# Patient Record
Sex: Male | Born: 1965 | Marital: Married | Smoking: Former smoker
Health system: Southern US, Community
[De-identification: ages and names within clinical notes are randomized; demographics above are authoritative.]

## PROBLEM LIST (undated history)

## (undated) DIAGNOSIS — I10 Essential (primary) hypertension: Secondary | ICD-10-CM

---

## 2011-11-12 ENCOUNTER — Other Ambulatory Visit: Payer: Self-pay | Admitting: Infectious Diseases

## 2011-11-12 ENCOUNTER — Ambulatory Visit
Admission: RE | Admit: 2011-11-12 | Discharge: 2011-11-12 | Disposition: A | Discharge status: Home / Self Care | Payer: No Typology Code available for payment source | Source: Ambulatory Visit | Attending: Infectious Diseases | Admitting: Infectious Diseases

## 2011-11-12 DIAGNOSIS — R7611 Nonspecific reaction to tuberculin skin test without active tuberculosis: Secondary | ICD-10-CM

## 2012-01-13 ENCOUNTER — Emergency Department (HOSPITAL_COMMUNITY)
Admission: EM | Admit: 2012-01-13 | Discharge: 2012-01-13 | Discharge status: Against Medical Advice | Payer: No Typology Code available for payment source | Attending: Emergency Medicine | Admitting: Emergency Medicine

## 2012-01-13 DIAGNOSIS — Z79899 Other long term (current) drug therapy: Secondary | ICD-10-CM | POA: Insufficient documentation

## 2012-01-13 DIAGNOSIS — Y939 Activity, unspecified: Secondary | ICD-10-CM | POA: Insufficient documentation
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
## 2016-02-11 ENCOUNTER — Encounter (HOSPITAL_COMMUNITY): Payer: Self-pay

## 2016-02-11 ENCOUNTER — Emergency Department (HOSPITAL_COMMUNITY)
Admission: EM | Admit: 2016-02-11 | Discharge: 2016-02-12 | Disposition: A | Discharge status: Home / Self Care | Payer: BLUE CROSS/BLUE SHIELD | Attending: Emergency Medicine | Admitting: Emergency Medicine

## 2016-02-11 ENCOUNTER — Emergency Department (HOSPITAL_COMMUNITY): Payer: BLUE CROSS/BLUE SHIELD

## 2016-02-11 DIAGNOSIS — I10 Essential (primary) hypertension: Secondary | ICD-10-CM | POA: Insufficient documentation

## 2016-02-11 DIAGNOSIS — Z79899 Other long term (current) drug therapy: Secondary | ICD-10-CM | POA: Diagnosis not present

## 2016-02-11 DIAGNOSIS — R079 Chest pain, unspecified: Secondary | ICD-10-CM

## 2016-02-11 DIAGNOSIS — Z87891 Personal history of nicotine dependence: Secondary | ICD-10-CM | POA: Insufficient documentation

## 2016-02-11 DIAGNOSIS — I712 Thoracic aortic aneurysm, without rupture, unspecified: Secondary | ICD-10-CM

## 2016-02-11 HISTORY — DX: Essential (primary) hypertension: I10

## 2016-02-11 LAB — I-STAT TROPONIN, ED: TROPONIN I, POC: 0 ng/mL (ref 0.00–0.08)

## 2016-02-11 LAB — BASIC METABOLIC PANEL
ANION GAP: 11 (ref 5–15)
BUN: 9 mg/dL (ref 6–20)
CHLORIDE: 102 mmol/L (ref 101–111)
CO2: 26 mmol/L (ref 22–32)
Calcium: 9.9 mg/dL (ref 8.9–10.3)
Creatinine, Ser: 1.09 mg/dL (ref 0.61–1.24)
GFR calc non Af Amer: >60 mL/min (ref >60)
Glucose, Bld: 122 mg/dL — ABNORMAL HIGH (ref 65–99)
Potassium: 3.8 mmol/L (ref 3.5–5.1)
SODIUM: 139 mmol/L (ref 135–145)

## 2016-02-11 LAB — CBC
HCT: 52.4 % — ABNORMAL HIGH (ref 39.0–52.0)
HEMOGLOBIN: 17.9 g/dL — AB (ref 13.0–17.0)
MCH: 28.7 pg (ref 26.0–34.0)
MCHC: 34.2 g/dL (ref 30.0–36.0)
MCV: 84.1 fL (ref 78.0–100.0)
Platelets: 228 10*3/uL (ref 150–400)
RBC: 6.23 MIL/uL — AB (ref 4.22–5.81)
RDW: 13.2 % (ref 11.5–15.5)
WBC: 7.7 10*3/uL (ref 4.0–10.5)

## 2016-02-11 NOTE — ED Provider Notes (Signed)
[facility]-EMERGENCY DEPT Provider Note   CSN: [id number] Arrival date & time: 02/11/16  1738   By signing my name below, I, Clarisse Gouge, attest that this documentation has been prepared under the direction and in the presence of Gilda Crease, MD. Electronically signed, Clarisse Gouge, ED Scribe. 02/12/16. 1:46 AM.   History   Chief Complaint Chief Complaint  Patient presents with  . Chest Pain   The history is provided by the patient and the spouse. No language interpreter was used.   HPI Comments: Jason Allison is a 50 y.o. male who presents to the Emergency Department from [facility] complaining of off and on chest pain and palpitations x 3 days. He reports associated fatigue. Pt notes he cannot reproduce the chest pain. He notes intermittent pain episodes lasting ~10 minutes at a time, and her further relays chest pain radiates into his back. He discloces prior 10 year Hx of tobacco use, though he quit years ago. Pt reveals FMHx and PMHx of HTN. He is not sure if he has a Hx of HLD as he states his levesl have never been checked.  He denies abdominal pain and Hx of blood clots, heart attack, or Dm. No other symptoms at this time.  Past Medical History:  Diagnosis Date  . Hypertension     There are no active problems to display for this patient.   History reviewed. No pertinent surgical history.     Home Medications    Prior to Admission medications   Medication Sig Start Date End Date Taking? Authorizing Provider  bisoprolol (ZEBETA) 5 MG tablet Take 5 mg by mouth daily.    Historical Provider, MD  lisinopril (PRINIVIL,ZESTRIL) 20 MG tablet Take 20 mg by mouth daily.    Historical Provider, MD    Family History No family history on file.  Social History Social History  Substance Use Topics  . Smoking status: Former Games developer  . Smokeless tobacco: Never Used  . Alcohol use No     Allergies   Patient has no known allergies.   Review of Systems Review of  Systems  All other systems reviewed and are negative. A complete 10 system review of systems was obtained and all systems are negative except as noted in the HPI and PMH.     Physical Exam Updated Vital Signs BP 158/96   Pulse 90   Temp 98.1 F (36.7 C) (Oral)   Resp 16   SpO2 97%   Physical Exam  Constitutional: He is oriented to person, place, and time. He appears well-developed and well-nourished. No distress.  HENT:  Head: Normocephalic and atraumatic.  Right Ear: Hearing normal.  Left Ear: Hearing normal.  Nose: Nose normal.  Mouth/Throat: Oropharynx is clear and moist and mucous membranes are normal.  Eyes: Conjunctivae and EOM are normal. Pupils are equal, round, and reactive to light.  Neck: Normal range of motion. Neck supple.  Cardiovascular: Normal rate, regular rhythm, S1 normal, S2 normal and normal heart sounds.  Exam reveals no gallop and no friction rub.   No murmur heard. Pulmonary/Chest: Effort normal and breath sounds normal. No respiratory distress. He exhibits tenderness (lateral).  Abdominal: Soft. Normal appearance and bowel sounds are normal. There is no hepatosplenomegaly. There is no tenderness. There is no rebound, no guarding, no tenderness at McBurney's point and negative Murphy's sign. No hernia.  Musculoskeletal: Normal range of motion.  Neurological: He is alert and oriented to person, place, and time. He has normal strength. No cranial  nerve deficit or sensory deficit. Coordination normal. GCS eye subscore is 4. GCS verbal subscore is 5. GCS motor subscore is 6.  Skin: Skin is warm, dry and intact. No rash noted. No cyanosis.  Psychiatric: He has a normal mood and affect. His speech is normal and behavior is normal. Thought content normal.  Nursing note and vitals reviewed.    ED Treatments / Results  Labs (all labs ordered are listed, but only abnormal results are displayed) Labs Reviewed  BASIC METABOLIC PANEL - Abnormal; Notable for the  following:       Result Value   Glucose, Bld 122 (*)    All other components within normal limits  CBC - Abnormal; Notable for the following:    RBC 6.23 (*)    Hemoglobin 17.9 (*)    HCT 52.4 (*)    All other components within normal limits  I-STAT TROPOININ, ED  I-STAT TROPOININ, ED    EKG  EKG Interpretation  Date/Time:  Tuesday February 11 2016 17:47:50 EST Ventricular Rate:  116 PR Interval:  132 QRS Duration: 102 QT Interval:  338 QTC Calculation: 469 R Axis:   117 Text Interpretation:  Sinus tachycardia Left posterior fascicular block Possible Inferior infarct , age undetermined Cannot rule out Anterior infarct , age undetermined Abnormal ECG No previous tracing Confirmed by Erickson Yamashiro  MD, Hadyn Azer [phone]([id number]) on 02/11/2016 11:24:23 PM       Radiology Dg Chest 2 View  Result Date: 02/11/2016 CLINICAL DATA:  Acute onset of left-sided chest pain, radiating to the back. Generalized weakness. Initial encounter. EXAM: CHEST  2 VIEW COMPARISON:  Chest radiograph performed 11/12/2011 FINDINGS: Lung expansion is mildly decreased from the prior study. There is no evidence of focal opacification, pleural effusion or pneumothorax. The heart is normal in size; the mediastinal contour is within normal limits. No acute osseous abnormalities are seen. IMPRESSION: No acute cardiopulmonary process seen. Electronically Signed   By: Roanna RaiderJeffery  Chang M.D.   On: 02/11/2016 18:51   Ct Angio Chest Pe W Or Wo Contrast  Result Date: 02/12/2016 CLINICAL DATA:  Acute onset of generalized chest pain. Tachycardia. Initial encounter. EXAM: CT ANGIOGRAPHY CHEST WITH CONTRAST TECHNIQUE: Multidetector CT imaging of the chest was performed using the standard protocol during bolus administration of intravenous contrast. Multiplanar CT image reconstructions and MIPs were obtained to evaluate the vascular anatomy. CONTRAST:  100 mL of Isovue 370 IV contrast COMPARISON:  Chest radiograph performed 02/11/2016  FINDINGS: Cardiovascular:  There is no evidence of pulmonary embolus. The heart is unremarkable in appearance. There is borderline dilatation of the ascending thoracic aorta to 4.1 cm in AP dimension. This resolves at the proximal aortic arch. No calcific atherosclerotic disease is seen. The great vessels are within normal limits. Mediastinum/Nodes: The mediastinum is otherwise unremarkable. No mediastinal lymphadenopathy is seen. No pericardial effusion is identified. The visualized portions of the thyroid gland are unremarkable. No axillary lymphadenopathy is appreciated. Lungs/Pleura: Minimal bibasilar atelectasis is noted. The lungs are otherwise clear. No pleural effusion or pneumothorax is seen. No masses are identified. A calcified granuloma is noted at the right middle lobe. Upper Abdomen: The visualized portions of the liver and spleen are unremarkable. The visualized portions of the pancreas are within normal limits. Musculoskeletal: No acute osseous abnormalities are identified. The visualized musculature is unremarkable in appearance. Review of the MIP images confirms the above findings. IMPRESSION: 1. No evidence of pulmonary embolus. 2. Minimal bibasilar atelectasis noted.  Lungs otherwise clear. 3. Borderline dilatation of the  ascending thoracic aorta to 4.1 cm in AP dimension. Recommend annual imaging followup by CTA or MRA. This recommendation follows 2010 ACCF/AHA/AATS/ACR/ASA/SCA/SCAI/SIR/STS/SVM Guidelines for the Diagnosis and Management of Patients with Thoracic Aortic Disease. Circulation. 2010; 121: W098-J191: [id number] Electronically Signed   By: Roanna RaiderJeffery  Chang M.D.   On: 02/12/2016 01:27    Procedures Procedures (including critical care time)  Medications Ordered in ED Medications  iopamidol (ISOVUE-370) 76 % injection (100 mLs  Contrast Given 02/12/16 0052)     Initial Impression / Assessment and Plan / ED Course  I have reviewed the triage vital signs and the nursing notes.  Pertinent  labs & imaging results that were available during my care of the patient were reviewed by me and considered in my medical decision making (see chart for details).  Clinical Course    Patient presents to the emergency department for evaluation of chest pain. He has been having intermittent episodes of discomfort in the left lateral chest area associated with palpitations. He does have some reproducibility of the pain with palpation of the chest wall. He had a mild sinus tachycardia at arrival which has resolved. Patient does appear to be slightly anxious. He has minimal cardiac risk factors. EKG with nonspecific T-wave inversions, possible LVH. No ST depressions or elevations. Patient has had 2 negative troponins here in the ER. Heart score is 3, felt to be adequate workup and does not require admission. Can follow-up as an outpatient.  Based on patient's chest pain and tachycardia, PE was also considered. CT angiography performed. No evidence of PE as noted, but patient does have borderline dilatation of the thoracic aorta consistent with aneurysm. No evidence of rupture. This does not expand the patient's current symptoms. Will need to be monitored. Patient will be referred to cardiology for monitoring of thoracic aortic aneurysm, workup for chest pain and palpitations.  Final Clinical Impressions(s) / ED Diagnoses   Final diagnoses:  Nonspecific chest pain  Thoracic aortic aneurysm without rupture (HCC)   I personally performed the services described in this documentation, which was scribed in my presence. The recorded information has been reviewed and is accurate.  New Prescriptions New Prescriptions   No medications on file     Gilda Creasehristopher J Phebe Dettmer, MD 02/12/16 [phone]
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
## 2016-02-11 NOTE — ED Notes (Signed)
EKG done

## 2016-02-12 ENCOUNTER — Emergency Department (HOSPITAL_COMMUNITY): Payer: BLUE CROSS/BLUE SHIELD

## 2016-02-12 LAB — I-STAT TROPONIN, ED: TROPONIN I, POC: 0 ng/mL (ref 0.00–0.08)

## 2016-02-12 MED ORDER — IOPAMIDOL (ISOVUE-370) INJECTION 76%
INTRAVENOUS | Status: AC
  Administered 2016-02-12: 100 mL
  Filled 2016-02-12: qty 100

## 2016-02-12 NOTE — ED Notes (Signed)
Pt departed in NAD, refused use of wheelchair.  

## 2017-04-25 IMAGING — CT CT ANGIO CHEST
2 of 6 series · 18 of 36 positions shown · IV contrast (Omni 300)
Comparison: Chest radiograph performed 02/11/2016

CLINICAL DATA: Acute onset of generalized chest pain. Tachycardia.
Initial encounter.

EXAM:
CT ANGIOGRAPHY CHEST WITH CONTRAST
TECHNIQUE: Multidetector CT imaging of the chest was performed using the
standard protocol during bolus administration of intravenous
contrast. Multiplanar CT image reconstructions and MIPs were
obtained to evaluate the vascular anatomy.
CONTRAST:  100 mL of Isovue 370 IV contrast

[Series 6: pe thins · axial · 0.69mm/px · z∈[+1425,+1634]mm · 17 of 237 slices shown]
[im 14/237  lung]
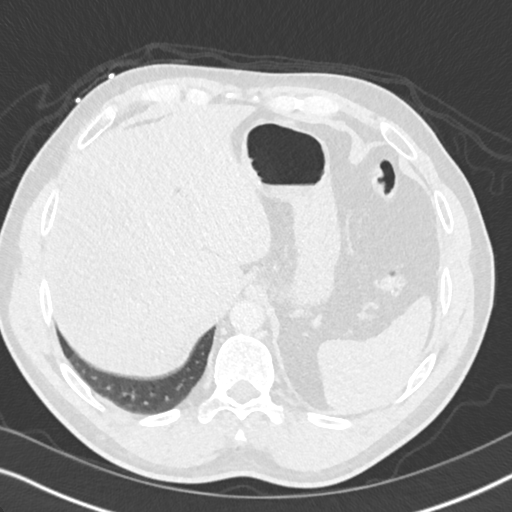
[im 27/237  mediastinal]
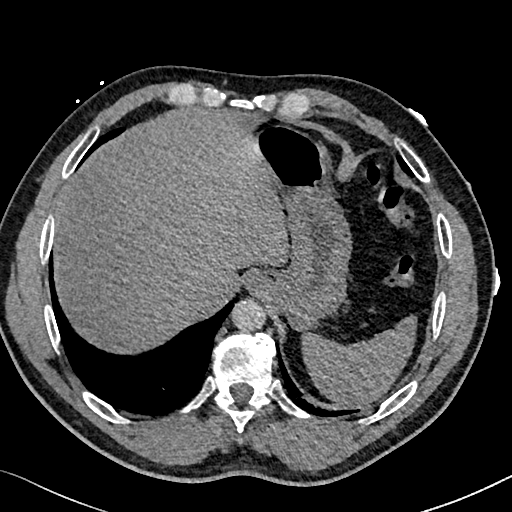
[im 40/237  lung]
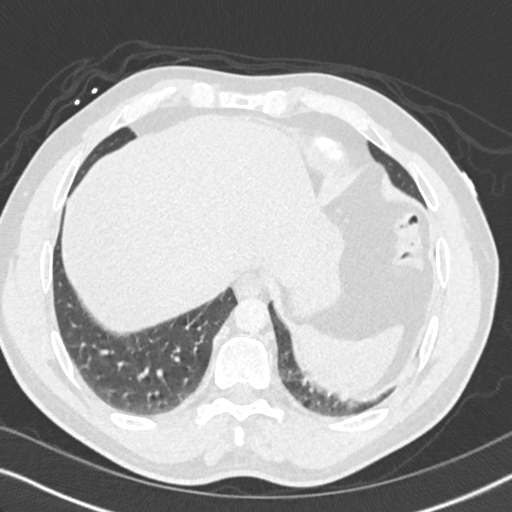
[im 53/237  mediastinal]
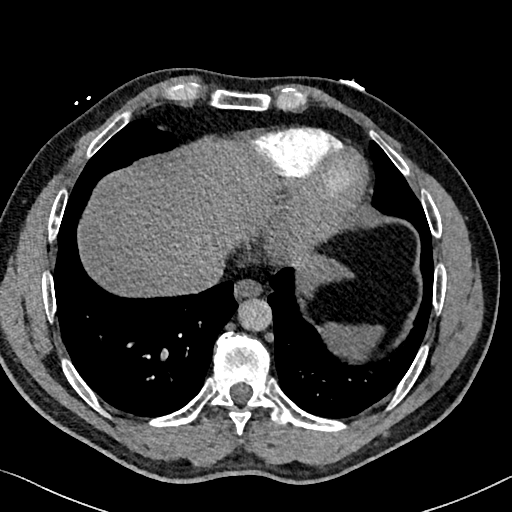
[im 66/237  lung]
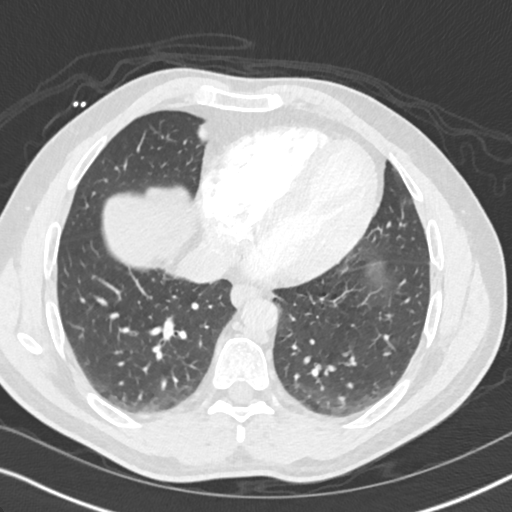
[im 79/237  mediastinal]
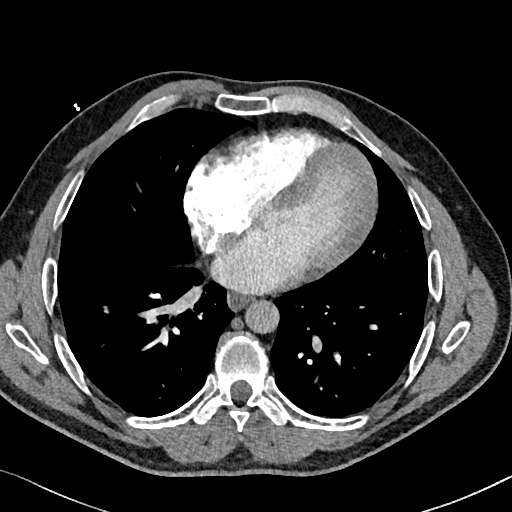
[im 92/237  lung]
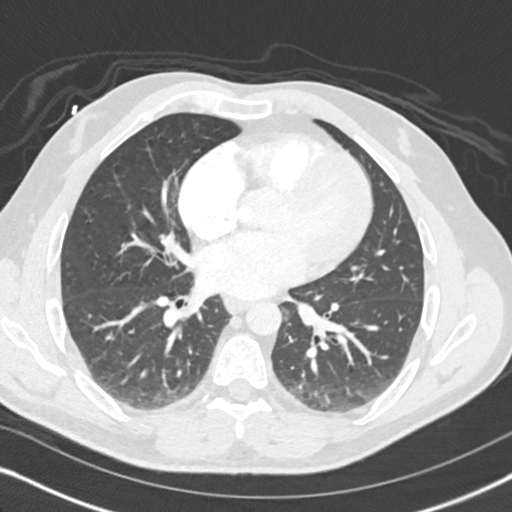
[im 105/237  mediastinal]
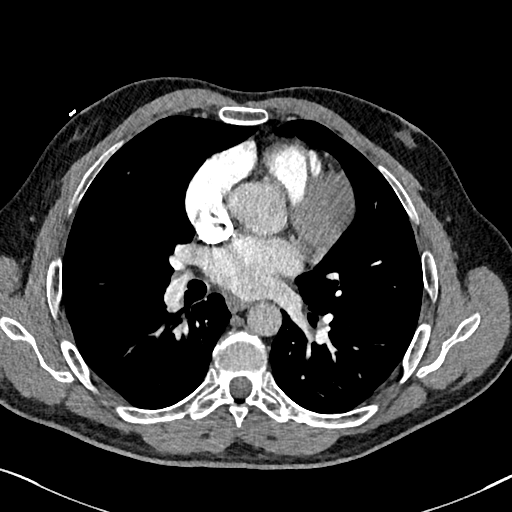
[im 119/237  lung]
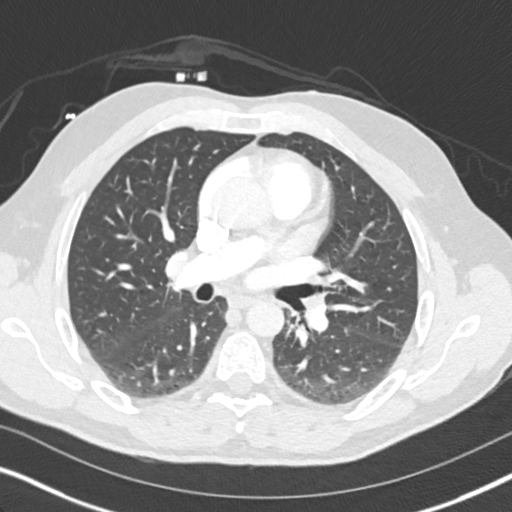
[im 132/237  mediastinal]
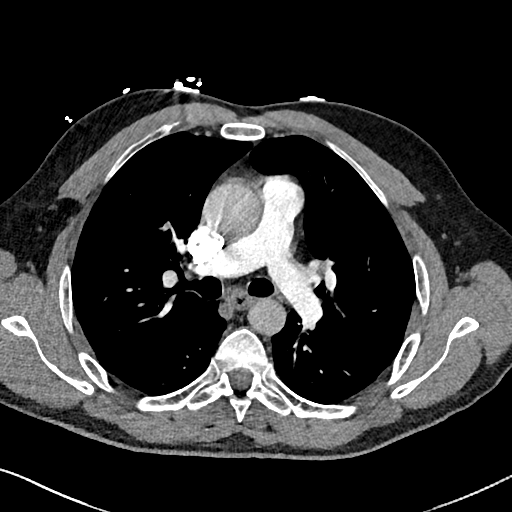
[im 145/237  lung]
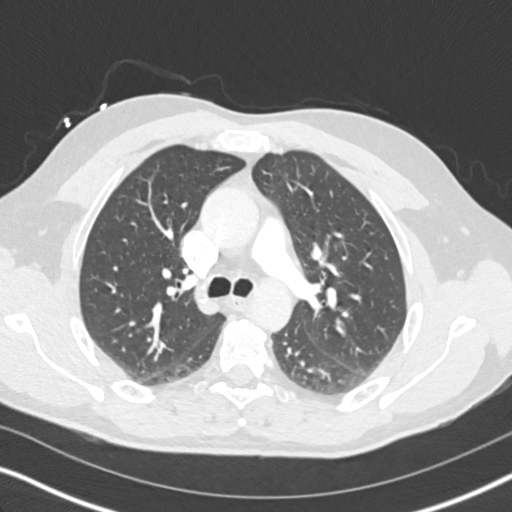
[im 158/237  mediastinal]
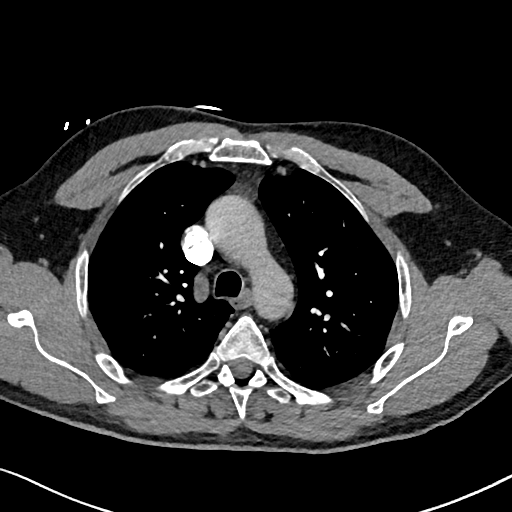
[im 171/237  lung]
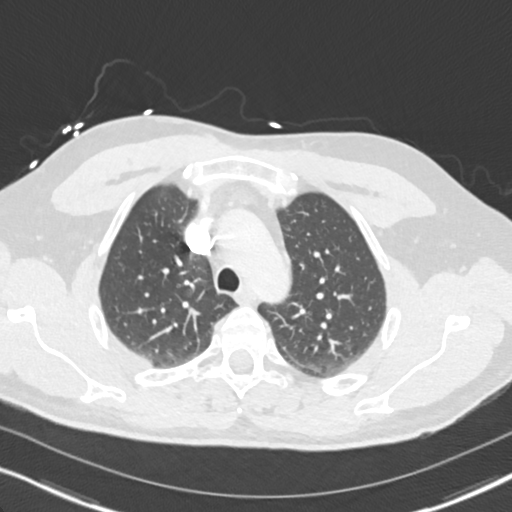
[im 184/237  mediastinal]
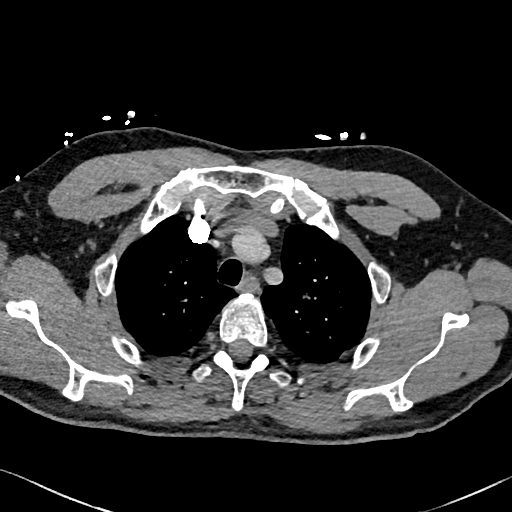
[im 197/237  lung]
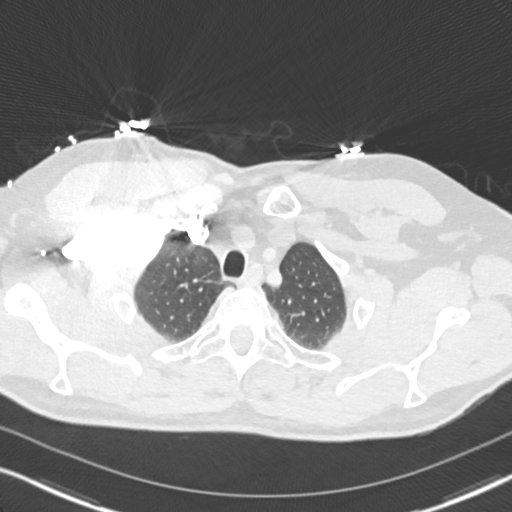
[im 210/237  mediastinal]
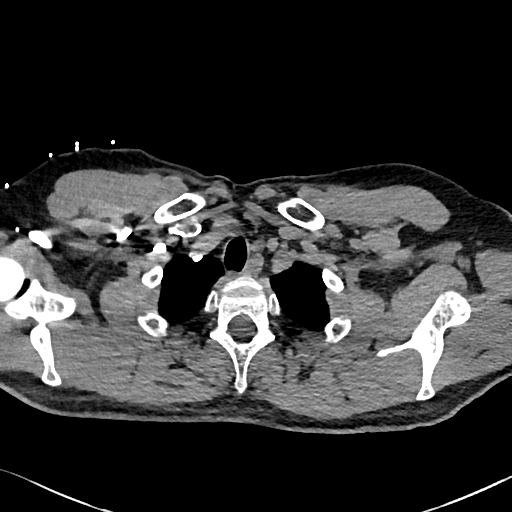
[im 223/237  lung]
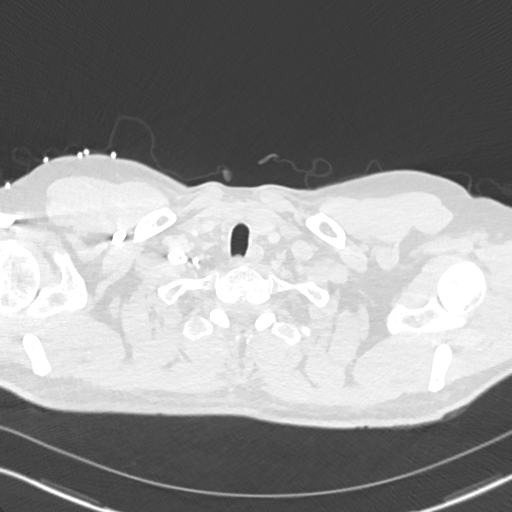

[Series 7: pe 2mm cor · coronal · 0.51mm/px · 1 of 146 slices shown]
[im 73/146  mediastinal]
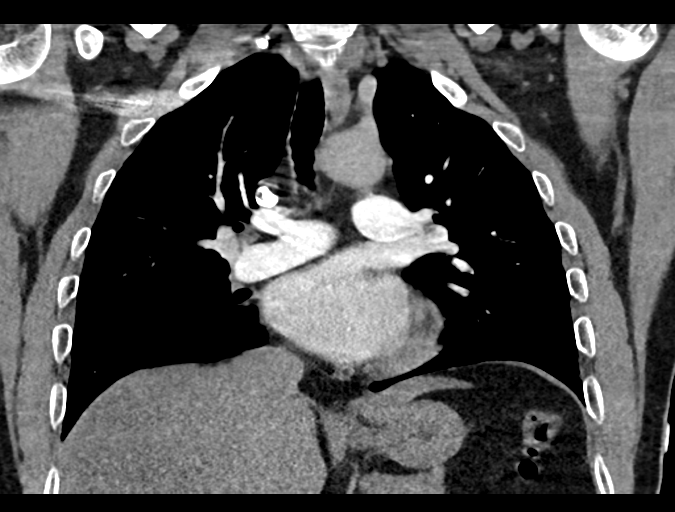

[18 of 36 positions shown; findings below may reference images not displayed]

FINDINGS: Cardiovascular:  There is no evidence of pulmonary embolus.

The heart is unremarkable in appearance. There is borderline
dilatation of the ascending thoracic aorta to 4.1 cm in AP
dimension. This resolves at the proximal aortic arch. No calcific
atherosclerotic disease is seen. The great vessels are within normal
limits.

Mediastinum/Nodes: The mediastinum is otherwise unremarkable. No
mediastinal lymphadenopathy is seen. No pericardial effusion is
identified. The visualized portions of the thyroid gland are
unremarkable. No axillary lymphadenopathy is appreciated.

Lungs/Pleura: Minimal bibasilar atelectasis is noted. The lungs are
otherwise clear. No pleural effusion or pneumothorax is seen. No
masses are identified. A calcified granuloma is noted at the right
middle lobe.

Upper Abdomen: The visualized portions of the liver and spleen are
unremarkable. The visualized portions of the pancreas are within
normal limits.

Musculoskeletal: No acute osseous abnormalities are identified. The
visualized musculature is unremarkable in appearance.

Review of the MIP images confirms the above findings.
IMPRESSION: 1. No evidence of pulmonary embolus.
2. Minimal bibasilar atelectasis noted.  Lungs otherwise clear.
3. Borderline dilatation of the ascending thoracic aorta to 4.1 cm
in AP dimension. Recommend annual imaging followup by CTA or MRA.
This recommendation follows 1797
ACCF/AHA/AATS/ACR/ASA/SCA/CEEJAY/IASC/PATIKE/DAUREN Guidelines for the
Diagnosis and Management of Patients with Thoracic Aortic Disease.
Circulation. 1797; 121: [id number]
